# Patient Record
Sex: Female | Born: 1940 | Race: Black or African American | Hispanic: No | Marital: Married
Health system: Southern US, Community
[De-identification: ages and names within clinical notes are randomized; demographics above are authoritative.]

---

## 2000-02-05 ENCOUNTER — Other Ambulatory Visit: Admission: RE | Admit: 2000-02-05 | Discharge: 2000-02-05 | Payer: Self-pay | Admitting: Gynecology

## 2003-05-04 ENCOUNTER — Other Ambulatory Visit: Admission: RE | Admit: 2003-05-04 | Discharge: 2003-05-04 | Payer: Self-pay | Admitting: Gynecology

## 2004-11-27 ENCOUNTER — Ambulatory Visit: Payer: Self-pay | Admitting: Family Medicine

## 2005-01-01 ENCOUNTER — Ambulatory Visit: Payer: Self-pay | Admitting: Family Medicine

## 2005-02-06 ENCOUNTER — Ambulatory Visit: Payer: Self-pay | Admitting: Family Medicine

## 2005-04-18 ENCOUNTER — Ambulatory Visit: Payer: Self-pay | Admitting: Family Medicine

## 2005-11-20 ENCOUNTER — Ambulatory Visit: Payer: Self-pay | Admitting: Family Medicine

## 2012-11-24 ENCOUNTER — Other Ambulatory Visit: Payer: Self-pay | Admitting: Gynecology

## 2012-11-24 DIAGNOSIS — R928 Other abnormal and inconclusive findings on diagnostic imaging of breast: Secondary | ICD-10-CM

## 2012-12-04 ENCOUNTER — Ambulatory Visit
Admission: RE | Admit: 2012-12-04 | Discharge: 2012-12-04 | Disposition: A | Discharge status: Home / Self Care | Payer: Medicare Other | Source: Ambulatory Visit | Attending: Gynecology | Admitting: Gynecology

## 2012-12-04 DIAGNOSIS — R928 Other abnormal and inconclusive findings on diagnostic imaging of breast: Secondary | ICD-10-CM

## 2013-06-29 ENCOUNTER — Other Ambulatory Visit: Payer: Self-pay | Admitting: Gynecology

## 2013-06-29 DIAGNOSIS — R922 Inconclusive mammogram: Secondary | ICD-10-CM

## 2013-07-13 ENCOUNTER — Ambulatory Visit
Admission: RE | Admit: 2013-07-13 | Discharge: 2013-07-13 | Disposition: A | Discharge status: Home / Self Care | Payer: Medicare HMO | Source: Ambulatory Visit | Attending: Gynecology | Admitting: Gynecology

## 2013-07-13 DIAGNOSIS — R922 Inconclusive mammogram: Secondary | ICD-10-CM

## 2013-12-31 ENCOUNTER — Other Ambulatory Visit: Payer: Self-pay | Admitting: Gynecology

## 2013-12-31 DIAGNOSIS — N6489 Other specified disorders of breast: Secondary | ICD-10-CM

## 2014-01-18 ENCOUNTER — Encounter (INDEPENDENT_AMBULATORY_CARE_PROVIDER_SITE_OTHER): Payer: Self-pay

## 2014-01-18 ENCOUNTER — Ambulatory Visit
Admission: RE | Admit: 2014-01-18 | Discharge: 2014-01-18 | Disposition: A | Discharge status: Home / Self Care | Payer: Medicare HMO | Source: Ambulatory Visit | Attending: Gynecology | Admitting: Gynecology

## 2014-01-18 DIAGNOSIS — N6489 Other specified disorders of breast: Secondary | ICD-10-CM

## 2016-02-17 DIAGNOSIS — Z1231 Encounter for screening mammogram for malignant neoplasm of breast: Secondary | ICD-10-CM | POA: Diagnosis not present

## 2016-03-02 DIAGNOSIS — I482 Chronic atrial fibrillation: Secondary | ICD-10-CM | POA: Diagnosis not present

## 2016-03-02 DIAGNOSIS — I10 Essential (primary) hypertension: Secondary | ICD-10-CM | POA: Diagnosis not present

## 2016-03-02 DIAGNOSIS — R7302 Impaired glucose tolerance (oral): Secondary | ICD-10-CM | POA: Diagnosis not present

## 2016-03-02 DIAGNOSIS — E782 Mixed hyperlipidemia: Secondary | ICD-10-CM | POA: Diagnosis not present

## 2016-05-30 DIAGNOSIS — J029 Acute pharyngitis, unspecified: Secondary | ICD-10-CM | POA: Diagnosis not present

## 2016-05-30 DIAGNOSIS — J039 Acute tonsillitis, unspecified: Secondary | ICD-10-CM | POA: Diagnosis not present

## 2016-05-31 DIAGNOSIS — J069 Acute upper respiratory infection, unspecified: Secondary | ICD-10-CM | POA: Diagnosis not present

## 2016-09-21 DIAGNOSIS — E119 Type 2 diabetes mellitus without complications: Secondary | ICD-10-CM | POA: Diagnosis not present

## 2016-09-21 DIAGNOSIS — E782 Mixed hyperlipidemia: Secondary | ICD-10-CM | POA: Diagnosis not present

## 2016-09-21 DIAGNOSIS — I10 Essential (primary) hypertension: Secondary | ICD-10-CM | POA: Diagnosis not present

## 2016-09-21 DIAGNOSIS — I482 Chronic atrial fibrillation: Secondary | ICD-10-CM | POA: Diagnosis not present

## 2016-09-21 DIAGNOSIS — R7302 Impaired glucose tolerance (oral): Secondary | ICD-10-CM | POA: Diagnosis not present

## 2016-09-21 DIAGNOSIS — Z0000 Encounter for general adult medical examination without abnormal findings: Secondary | ICD-10-CM | POA: Diagnosis not present

## 2017-02-22 DIAGNOSIS — H2513 Age-related nuclear cataract, bilateral: Secondary | ICD-10-CM | POA: Diagnosis not present

## 2017-02-25 ENCOUNTER — Other Ambulatory Visit: Payer: Self-pay | Admitting: Family Medicine

## 2017-02-25 DIAGNOSIS — Z1231 Encounter for screening mammogram for malignant neoplasm of breast: Secondary | ICD-10-CM

## 2017-03-22 ENCOUNTER — Ambulatory Visit
Admission: RE | Admit: 2017-03-22 | Discharge: 2017-03-22 | Disposition: A | Discharge status: Home / Self Care | Payer: PPO | Source: Ambulatory Visit | Attending: Family Medicine | Admitting: Family Medicine

## 2017-03-22 DIAGNOSIS — Z1231 Encounter for screening mammogram for malignant neoplasm of breast: Secondary | ICD-10-CM

## 2017-03-25 ENCOUNTER — Other Ambulatory Visit: Payer: Self-pay | Admitting: Family Medicine

## 2017-03-25 DIAGNOSIS — R928 Other abnormal and inconclusive findings on diagnostic imaging of breast: Secondary | ICD-10-CM

## 2017-03-27 ENCOUNTER — Ambulatory Visit
Admission: RE | Admit: 2017-03-27 | Discharge: 2017-03-27 | Disposition: A | Discharge status: Home / Self Care | Payer: PPO | Source: Ambulatory Visit | Attending: Family Medicine | Admitting: Family Medicine

## 2017-03-27 DIAGNOSIS — R928 Other abnormal and inconclusive findings on diagnostic imaging of breast: Secondary | ICD-10-CM

## 2017-03-27 DIAGNOSIS — N6489 Other specified disorders of breast: Secondary | ICD-10-CM | POA: Diagnosis not present

## 2017-03-29 DIAGNOSIS — I482 Chronic atrial fibrillation: Secondary | ICD-10-CM | POA: Diagnosis not present

## 2017-03-29 DIAGNOSIS — I10 Essential (primary) hypertension: Secondary | ICD-10-CM | POA: Diagnosis not present

## 2017-03-29 DIAGNOSIS — E782 Mixed hyperlipidemia: Secondary | ICD-10-CM | POA: Diagnosis not present

## 2017-03-29 DIAGNOSIS — E119 Type 2 diabetes mellitus without complications: Secondary | ICD-10-CM | POA: Diagnosis not present

## 2017-09-30 DIAGNOSIS — Z23 Encounter for immunization: Secondary | ICD-10-CM | POA: Diagnosis not present

## 2017-09-30 DIAGNOSIS — N6342 Unspecified lump in left breast, subareolar: Secondary | ICD-10-CM | POA: Diagnosis not present

## 2017-10-01 ENCOUNTER — Other Ambulatory Visit: Payer: Self-pay | Admitting: Family Medicine

## 2017-10-01 DIAGNOSIS — N6320 Unspecified lump in the left breast, unspecified quadrant: Secondary | ICD-10-CM

## 2017-10-04 ENCOUNTER — Ambulatory Visit
Admission: RE | Admit: 2017-10-04 | Discharge: 2017-10-04 | Disposition: A | Discharge status: Home / Self Care | Payer: PPO | Source: Ambulatory Visit | Attending: Family Medicine | Admitting: Family Medicine

## 2017-10-04 DIAGNOSIS — N6489 Other specified disorders of breast: Secondary | ICD-10-CM | POA: Diagnosis not present

## 2017-10-04 DIAGNOSIS — N6320 Unspecified lump in the left breast, unspecified quadrant: Secondary | ICD-10-CM

## 2017-10-04 DIAGNOSIS — R928 Other abnormal and inconclusive findings on diagnostic imaging of breast: Secondary | ICD-10-CM | POA: Diagnosis not present

## 2017-10-11 DIAGNOSIS — I482 Chronic atrial fibrillation: Secondary | ICD-10-CM | POA: Diagnosis not present

## 2017-10-11 DIAGNOSIS — E782 Mixed hyperlipidemia: Secondary | ICD-10-CM | POA: Diagnosis not present

## 2017-10-11 DIAGNOSIS — I10 Essential (primary) hypertension: Secondary | ICD-10-CM | POA: Diagnosis not present

## 2017-10-11 DIAGNOSIS — E119 Type 2 diabetes mellitus without complications: Secondary | ICD-10-CM | POA: Diagnosis not present

## 2018-01-10 DIAGNOSIS — S46012A Strain of muscle(s) and tendon(s) of the rotator cuff of left shoulder, initial encounter: Secondary | ICD-10-CM | POA: Diagnosis not present

## 2018-03-17 ENCOUNTER — Other Ambulatory Visit: Payer: Self-pay | Admitting: Family Medicine

## 2018-03-17 DIAGNOSIS — Z1231 Encounter for screening mammogram for malignant neoplasm of breast: Secondary | ICD-10-CM

## 2018-03-25 DIAGNOSIS — H2513 Age-related nuclear cataract, bilateral: Secondary | ICD-10-CM | POA: Diagnosis not present

## 2018-04-11 DIAGNOSIS — E119 Type 2 diabetes mellitus without complications: Secondary | ICD-10-CM | POA: Diagnosis not present

## 2018-04-11 DIAGNOSIS — I10 Essential (primary) hypertension: Secondary | ICD-10-CM | POA: Diagnosis not present

## 2018-04-11 DIAGNOSIS — I482 Chronic atrial fibrillation: Secondary | ICD-10-CM | POA: Diagnosis not present

## 2018-04-11 DIAGNOSIS — E782 Mixed hyperlipidemia: Secondary | ICD-10-CM | POA: Diagnosis not present

## 2018-04-17 ENCOUNTER — Ambulatory Visit
Admission: RE | Admit: 2018-04-17 | Discharge: 2018-04-17 | Disposition: A | Discharge status: Home / Self Care | Payer: PPO | Source: Ambulatory Visit | Attending: Family Medicine | Admitting: Family Medicine

## 2018-04-17 DIAGNOSIS — Z1231 Encounter for screening mammogram for malignant neoplasm of breast: Secondary | ICD-10-CM | POA: Diagnosis not present

## 2019-03-24 ENCOUNTER — Other Ambulatory Visit: Payer: Self-pay | Admitting: Family Medicine

## 2019-03-24 DIAGNOSIS — Z1231 Encounter for screening mammogram for malignant neoplasm of breast: Secondary | ICD-10-CM

## 2019-05-07 ENCOUNTER — Ambulatory Visit
Admission: RE | Admit: 2019-05-07 | Discharge: 2019-05-07 | Disposition: A | Discharge status: Home / Self Care | Payer: Medicare HMO | Source: Ambulatory Visit | Attending: Family Medicine | Admitting: Family Medicine

## 2019-05-07 ENCOUNTER — Other Ambulatory Visit: Payer: Self-pay

## 2019-05-07 DIAGNOSIS — Z1231 Encounter for screening mammogram for malignant neoplasm of breast: Secondary | ICD-10-CM

## 2020-04-25 ENCOUNTER — Other Ambulatory Visit: Payer: Self-pay | Admitting: Family Medicine

## 2020-04-25 DIAGNOSIS — Z1231 Encounter for screening mammogram for malignant neoplasm of breast: Secondary | ICD-10-CM

## 2020-05-17 ENCOUNTER — Other Ambulatory Visit: Payer: Self-pay

## 2020-05-17 ENCOUNTER — Ambulatory Visit
Admission: RE | Admit: 2020-05-17 | Discharge: 2020-05-17 | Disposition: A | Discharge status: Home / Self Care | Payer: Medicare HMO | Source: Ambulatory Visit | Attending: Family Medicine | Admitting: Family Medicine

## 2020-05-17 DIAGNOSIS — Z1231 Encounter for screening mammogram for malignant neoplasm of breast: Secondary | ICD-10-CM

## 2020-12-27 ENCOUNTER — Encounter: Payer: Self-pay | Admitting: Gastroenterology

## 2021-04-27 ENCOUNTER — Other Ambulatory Visit: Payer: Self-pay | Admitting: Family Medicine

## 2021-04-27 DIAGNOSIS — Z1231 Encounter for screening mammogram for malignant neoplasm of breast: Secondary | ICD-10-CM

## 2021-05-19 ENCOUNTER — Other Ambulatory Visit: Payer: Self-pay

## 2021-05-19 ENCOUNTER — Ambulatory Visit
Admission: RE | Admit: 2021-05-19 | Discharge: 2021-05-19 | Disposition: A | Discharge status: Home / Self Care | Payer: Medicare HMO | Source: Ambulatory Visit | Attending: Family Medicine | Admitting: Family Medicine

## 2021-05-19 DIAGNOSIS — Z1231 Encounter for screening mammogram for malignant neoplasm of breast: Secondary | ICD-10-CM

## 2022-05-09 ENCOUNTER — Other Ambulatory Visit: Payer: Self-pay | Admitting: Family Medicine

## 2022-05-09 DIAGNOSIS — Z1231 Encounter for screening mammogram for malignant neoplasm of breast: Secondary | ICD-10-CM

## 2022-06-05 ENCOUNTER — Ambulatory Visit
Admission: RE | Admit: 2022-06-05 | Discharge: 2022-06-05 | Disposition: A | Discharge status: Home / Self Care | Payer: Medicare HMO | Source: Ambulatory Visit | Attending: Family Medicine | Admitting: Family Medicine

## 2022-06-05 DIAGNOSIS — Z1231 Encounter for screening mammogram for malignant neoplasm of breast: Secondary | ICD-10-CM

## 2023-01-07 IMAGING — MG MM DIGITAL SCREENING BILAT W/ TOMO AND CAD
8 series · 9 of 24 positions shown · non-contrast
Comparison: Previous exam(s).

CLINICAL DATA: Screening.

EXAM:
DIGITAL SCREENING BILATERAL MAMMOGRAM WITH TOMOSYNTHESIS AND CAD
TECHNIQUE: Bilateral screening digital craniocaudal and mediolateral oblique
mammograms were obtained. Bilateral screening digital breast
tomosynthesis was performed. The images were evaluated with
computer-aided detection.

[R CC synth-2D]
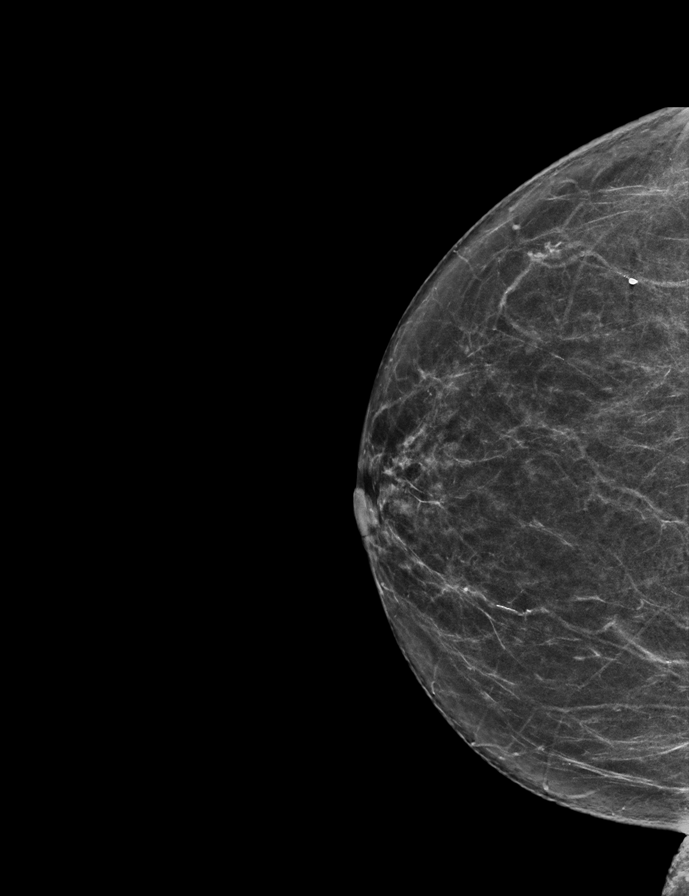

[L MLO synth-2D]
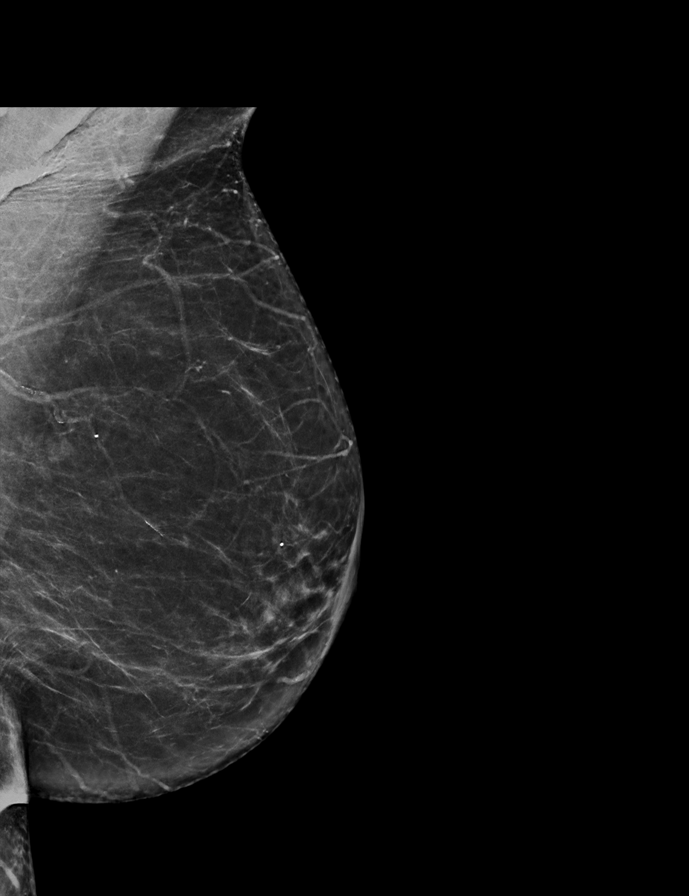

[R MLO synth-2D]
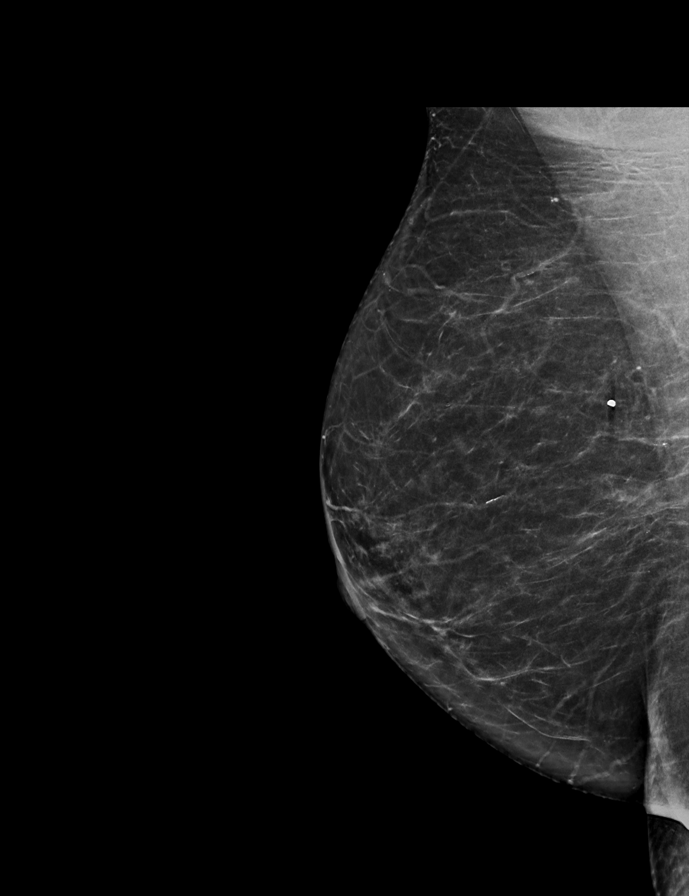

[L CC synth-2D]
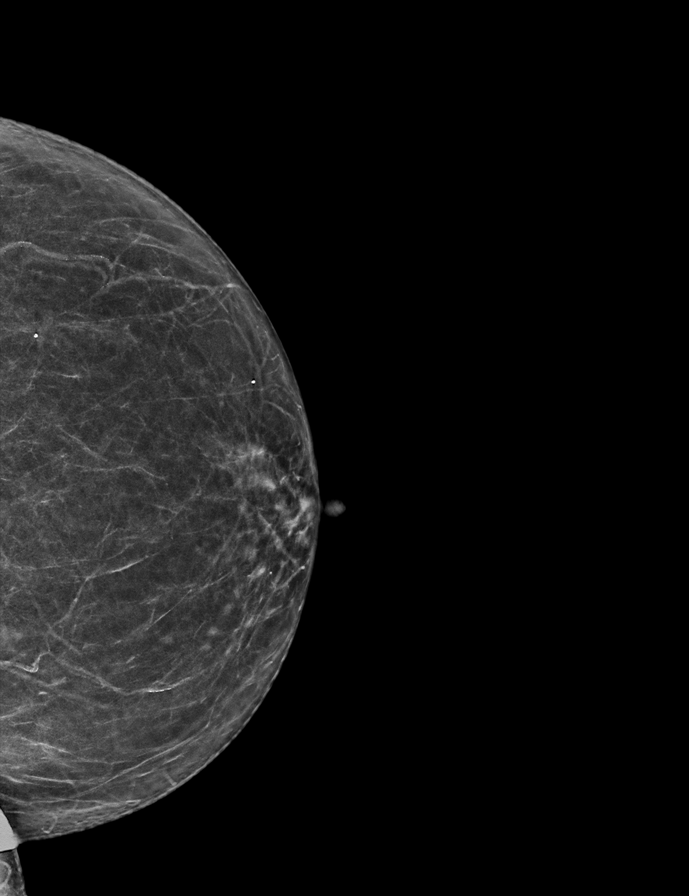

[L MLO tomo · 2 of 69 frames shown]
[frame 23/69]
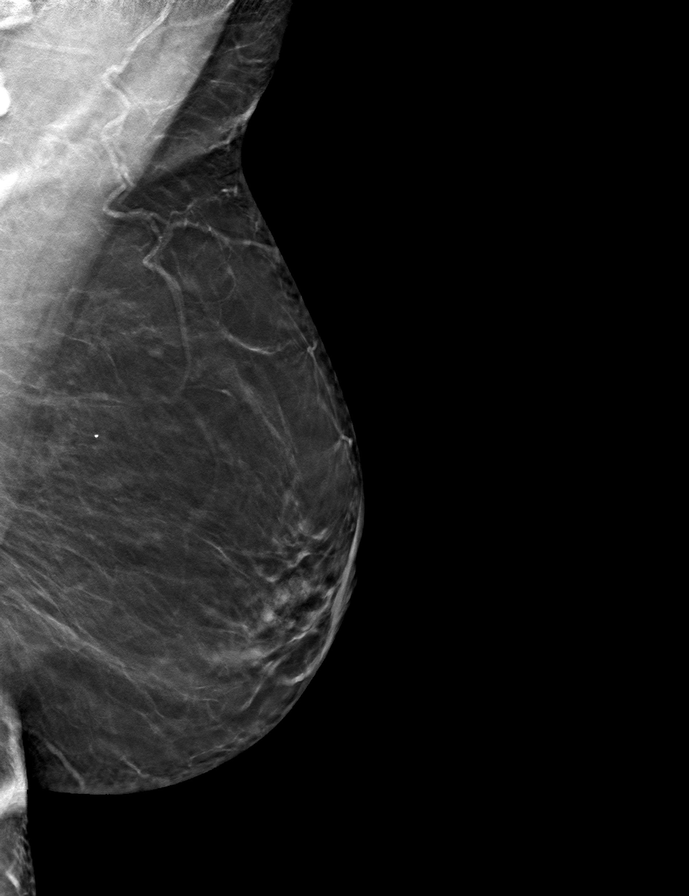
[frame 35/69]
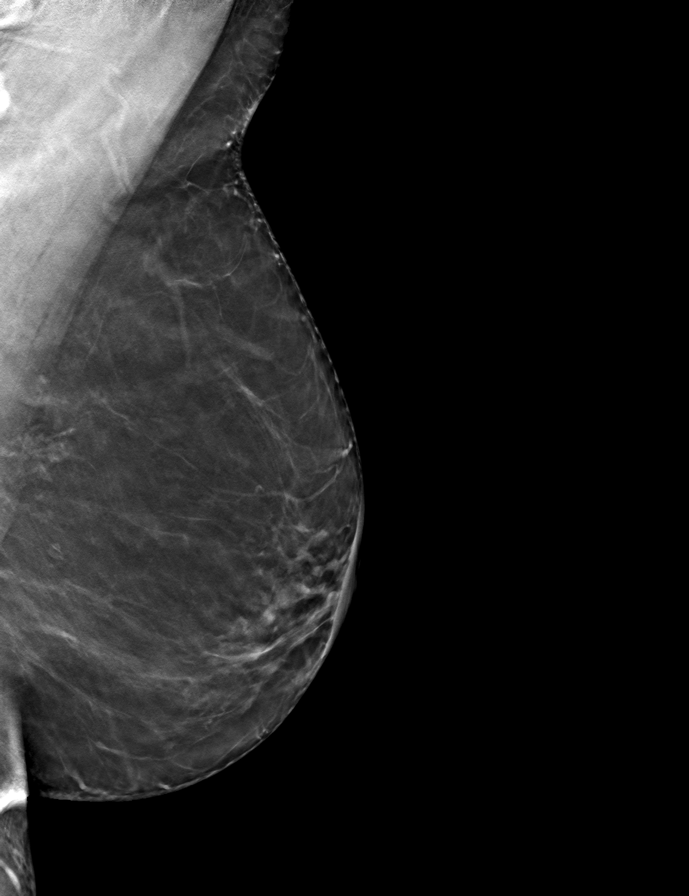

[L CC tomo · tomo slice 29/56.0]
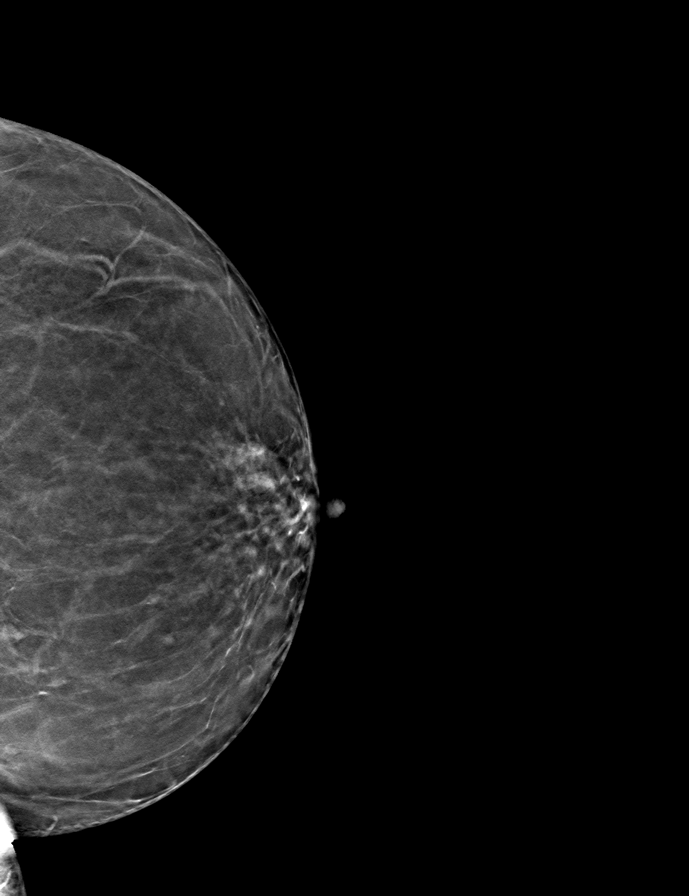

[R CC tomo · tomo slice 31/62.0]
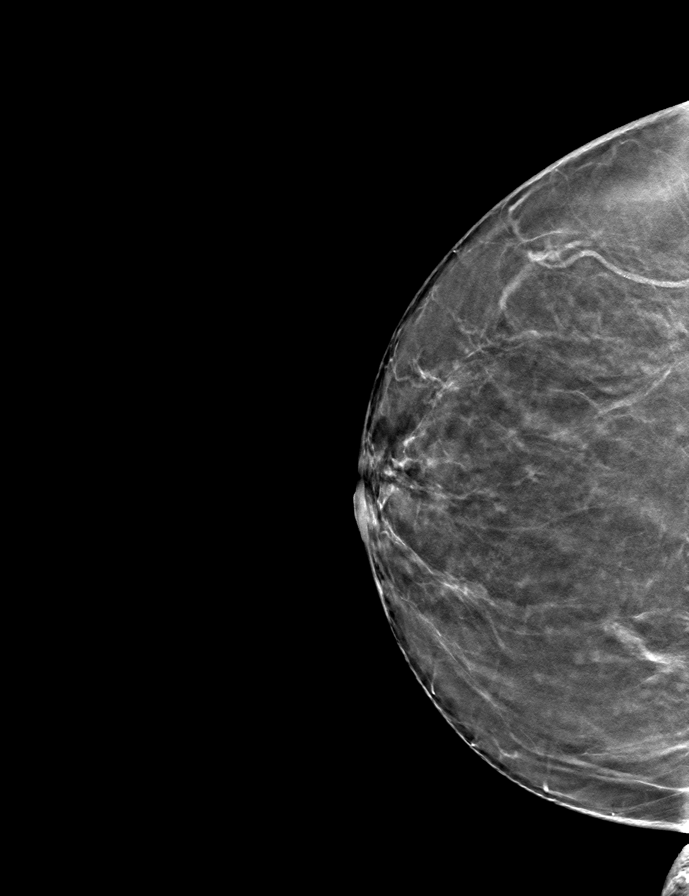

[R MLO tomo · tomo slice 35/68.0]
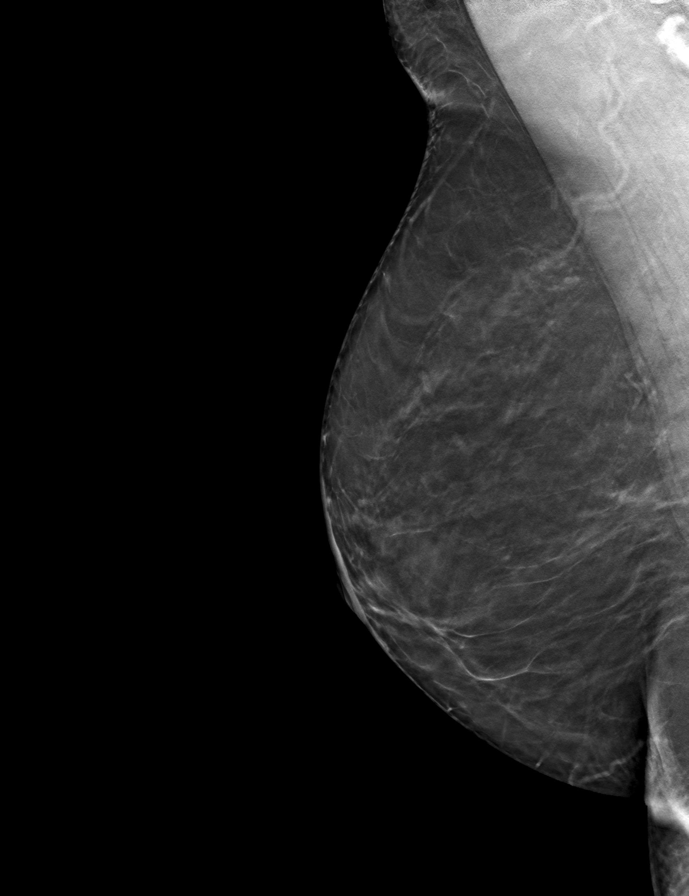

[9 of 24 positions shown; findings below may reference images not displayed]

ACR Breast Density Category b: There are scattered areas of
fibroglandular density.
FINDINGS: There are no findings suspicious for malignancy.
IMPRESSION: No mammographic evidence of malignancy. A result letter of this
screening mammogram will be mailed directly to the patient.

RECOMMENDATION:
Screening mammogram in one year. (Code:51-O-LD2)

BI-RADS CATEGORY  1: Negative.

## 2023-05-07 ENCOUNTER — Other Ambulatory Visit: Payer: Self-pay | Admitting: Family Medicine

## 2023-05-07 DIAGNOSIS — Z1231 Encounter for screening mammogram for malignant neoplasm of breast: Secondary | ICD-10-CM

## 2023-06-07 ENCOUNTER — Ambulatory Visit
Admission: RE | Admit: 2023-06-07 | Discharge: 2023-06-07 | Disposition: A | Discharge status: Home / Self Care | Payer: Medicare HMO | Source: Ambulatory Visit | Attending: Family Medicine | Admitting: Family Medicine

## 2023-06-07 DIAGNOSIS — Z1231 Encounter for screening mammogram for malignant neoplasm of breast: Secondary | ICD-10-CM

## 2024-05-21 ENCOUNTER — Other Ambulatory Visit: Payer: Self-pay | Admitting: Family Medicine

## 2024-05-21 DIAGNOSIS — Z1231 Encounter for screening mammogram for malignant neoplasm of breast: Secondary | ICD-10-CM

## 2024-06-09 ENCOUNTER — Ambulatory Visit
Admission: RE | Admit: 2024-06-09 | Discharge: 2024-06-09 | Disposition: A | Discharge status: Home / Self Care | Source: Ambulatory Visit | Attending: Family Medicine | Admitting: Family Medicine

## 2024-06-09 DIAGNOSIS — Z1231 Encounter for screening mammogram for malignant neoplasm of breast: Secondary | ICD-10-CM
